# Patient Record
Sex: Female | Born: 1940 | Race: White | Hispanic: No | Smoking: Never smoker
Health system: Southern US, Community
[De-identification: ages and names within clinical notes are randomized; demographics above are authoritative.]

## PROBLEM LIST (undated history)

## (undated) DIAGNOSIS — I10 Essential (primary) hypertension: Secondary | ICD-10-CM

## (undated) DIAGNOSIS — F419 Anxiety disorder, unspecified: Secondary | ICD-10-CM

## (undated) HISTORY — PX: ROTATOR CUFF REPAIR: SHX139

## (undated) HISTORY — PX: BUNIONECTOMY: SHX129

## (undated) HISTORY — PX: FRACTURE SURGERY: SHX138

## (undated) HISTORY — PX: ABDOMINAL HYSTERECTOMY: SHX81

## (undated) HISTORY — PX: EYE SURGERY: SHX253

---

## 2018-08-23 ENCOUNTER — Encounter: Payer: Self-pay | Admitting: Emergency Medicine

## 2018-08-23 ENCOUNTER — Other Ambulatory Visit: Payer: Self-pay

## 2018-08-23 ENCOUNTER — Ambulatory Visit
Admission: EM | Admit: 2018-08-23 | Discharge: 2018-08-23 | Disposition: A | Discharge status: Home / Self Care | Payer: Medicare PPO | Attending: Family Medicine | Admitting: Family Medicine

## 2018-08-23 DIAGNOSIS — N3000 Acute cystitis without hematuria: Secondary | ICD-10-CM | POA: Diagnosis not present

## 2018-08-23 HISTORY — DX: Essential (primary) hypertension: I10

## 2018-08-23 HISTORY — DX: Anxiety disorder, unspecified: F41.9

## 2018-08-23 LAB — URINALYSIS, COMPLETE (UACMP) WITH MICROSCOPIC
Bilirubin Urine: NEGATIVE
Glucose, UA: NEGATIVE mg/dL
Ketones, ur: NEGATIVE mg/dL
Nitrite: NEGATIVE
Protein, ur: NEGATIVE mg/dL
Specific Gravity, Urine: 1.02 (ref 1.005–1.030)
pH: 6 (ref 5.0–8.0)

## 2018-08-23 MED ORDER — CIPROFLOXACIN HCL 500 MG PO TABS: 500.0000 mg | ORAL_TABLET | Freq: Two times a day (BID) | ORAL | 0 refills | Status: AC

## 2018-08-23 NOTE — ED Provider Notes (Signed)
[address], [address] [place], [place] [address] [phone]   Name: Ann Bush DOB: 06/18/1940 MRN: [id number] CSN: [id number] PCP: Marlan PalauGard, [name]  Arrival date and time:  08/23/18 1258  Chief Complaint:  Back Pain  NOTE: Prior to seeing the patient today, I have reviewed the triage nursing documentation and vital signs. Clinical staff has updated patient's PMH/PSHx, current medication list, and drug allergies/intolerances to ensure comprehensive history available to assist in medical decision making.   History:   HPI: Ann Bush is a 78 y.o. female who presents today with complaints of pain in RIGHT side of her lower back and abdomen that began with acute onset on 08/22/2018. No known injuries to her back. She denies dysuria and urinary frequency. No nausea, vomiting, or changes to her bowel habits. Patient has not appreciated any gross hematuria. Patient has not mounted a fever response to her current symptoms. Patient denies PMH being significant for recurrent urinary tract infections.   Past Medical History:  Diagnosis Date  . Anxiety   . Hypertension     Past Surgical History:  Procedure Laterality Date  . ABDOMINAL HYSTERECTOMY    . BUNIONECTOMY Bilateral   . EYE SURGERY    . FRACTURE SURGERY     nose, wrist  . ROTATOR CUFF REPAIR Right     History reviewed. No pertinent family history.  Social History   Socioeconomic History  . Marital status: Widowed    Spouse name: Not on file  . Number of children: Not on file  . Years of education: Not on file  . Highest education level: Not on file  Occupational History  . Not on file  Social Needs  . Financial resource strain: Not on file  . Food insecurity:    Worry: Not on file    Inability: Not on file  . Transportation needs:    Medical: Not on file    Non-medical: Not on file  Tobacco Use  . Smoking status: Never Smoker  . Smokeless tobacco: Never Used  Substance and Sexual Activity  . Alcohol  use: Not Currently  . Drug use: Not Currently  . Sexual activity: Not on file  Lifestyle  . Physical activity:    Days per week: Not on file    Minutes per session: Not on file  . Stress: Not on file  Relationships  . Social connections:    Talks on phone: Not on file    Gets together: Not on file    Attends religious service: Not on file    Active member of club or organization: Not on file    Attends meetings of clubs or organizations: Not on file    Relationship status: Not on file  . Intimate partner violence:    Fear of current or ex partner: Not on file    Emotionally abused: Not on file    Physically abused: Not on file    Forced sexual activity: Not on file  Other Topics Concern  . Not on file  Social History Narrative  . Not on file    There are no active problems to display for this patient.   Home Medications:    Current Meds  Medication Sig  . carvedilol (COREG) 12.5 MG tablet Take by mouth.  . escitalopram (LEXAPRO) 20 MG tablet 1 tab daily    Allergies:   Patient has no known allergies.  Review of Systems (ROS): Review of Systems  Constitutional: Negative for chills and fever.  Respiratory: Negative for  cough and shortness of breath.   Cardiovascular: Negative for chest pain and palpitations.  Gastrointestinal: Positive for abdominal pain (RLQ into groin; no flank pain). Negative for abdominal distention, constipation, diarrhea, nausea and vomiting.  Genitourinary: Positive for decreased urine volume. Negative for dysuria, flank pain, frequency, hematuria and urgency.  Musculoskeletal: Positive for back pain (RIGHT lower).  Skin: Negative for color change and rash.  All other systems reviewed and are negative.    Physical Exam:  Triage Vital Signs ED Triage Vitals  Enc Vitals Group     BP 08/23/18 1322 (!) 168/74     Pulse Rate 08/23/18 1322 80     Resp 08/23/18 1322 18     Temp 08/23/18 1322 98.2 F (36.8 C)     Temp Source 08/23/18 1322  Oral     SpO2 08/23/18 1322 96 %     Weight 08/23/18 1316 168 lb (76.2 kg)     Height 08/23/18 1316 5\' 6"  (1.676 m)     Head Circumference --      Peak Flow --      Pain Score 08/23/18 1316 4     Pain Loc --      Pain Edu? --      Excl. in GC? --     Physical Exam  Constitutional: She is oriented to person, place, and time and well-developed, well-nourished, and in no distress.  HENT:  Head: Normocephalic and atraumatic.  Mouth/Throat: Mucous membranes are normal.  Neck: Neck supple.  Cardiovascular: Normal rate, regular rhythm, normal heart sounds and intact distal pulses. Exam reveals no gallop and no friction rub.  No murmur heard. Pulmonary/Chest: Effort normal and breath sounds normal. No respiratory distress. She has no wheezes. She has no rales.  Abdominal: Soft. Bowel sounds are normal. She exhibits no distension. There is abdominal tenderness in the right lower quadrant and suprapubic area. There is CVA tenderness (RIGHT).  Musculoskeletal:     Lumbar back: She exhibits pain (RIGHT lower).  Neurological: She is alert and oriented to person, place, and time.  Skin: Skin is warm and dry. No rash noted. No erythema.  Psychiatric: Mood, affect and judgment normal.  Nursing note and vitals reviewed.    Urgent Care Treatments / Results:   LABS: PLEASE NOTE: all labs that were ordered this encounter are listed, however only abnormal results are displayed. Labs Reviewed  URINALYSIS, COMPLETE (UACMP) WITH MICROSCOPIC - Abnormal; Notable for the following components:      Result Value   Hgb urine dipstick TRACE (*)    Leukocytes,Ua TRACE (*)    Bacteria, UA FEW (*)    All other components within normal limits    EKG: No orders found for this or any previous visit.  RADIOLOGY: No results found.  PRODEDURES: Procedures  MEDICATIONS RECEIVED THIS VISIT: Medications - No data to display  PERTINENT CLINICAL COURSE NOTES/UPDATES: No data to display   Initial  Impression / Assessment and Plan / Urgent Care Course:    Ann Bush is a 78 y.o. female who presents to [facility] today with complaints of Back Pain  Pertinent labs & imaging results that were available during my care of the patient were personally reviewed by me and considered in my medical decision making (see lab/imaging section of note for values and interpretations).  No fevers or overt urinary symptoms. Her exam, however reveals CVAT on the RIGHT, and pain in her RIGHT lower abdomen and suprapubic area.  UA positive for LE;  reflex culture sent. Will cover with a 5 day course of ciprofloxacin. May use Tylenol and/or Ibuprofen as needed for pain. Discussed Azo products should she develop dysuria. Encouraged to increase fluid intake and avoid caffeine to prevent bladder spasms.   Discussed follow up with primary care physician this week for re-evaluation. I have reviewed the follow up and strict return precautions for any new or worsening symptoms. Patient is aware of symptoms that would be deemed urgent/emergent, and would thus require further evaluation either here or in the emergency department. At the time of discharge, she verbalized understanding and consent with the discharge plan as it was reviewed with her. All questions were fielded by provider and/or clinic staff prior to patient discharge.    Final Clinical Impressions(s) / Urgent Care Diagnoses:   Final diagnoses:  Acute cystitis without hematuria    New Prescriptions:   Meds ordered this encounter  Medications  . ciprofloxacin (CIPRO) 500 MG tablet    Sig: Take 1 tablet (500 mg total) by mouth 2 (two) times daily for 5 days.    Dispense:  10 tablet    Refill:  0    Controlled Substance Prescriptions:  [place] Controlled Substance Registry consulted? Not Applicable  NOTE: This note was prepared using Dragon dictation software along with smaller phrase technology. Despite my best ability to proofread, there is the  potential that transcriptional errors may still occur from this process, and are completely unintentional.     Verlee Monte, NP 08/23/18 1359

## 2018-08-23 NOTE — ED Triage Notes (Signed)
Pt c/o lower back and lower abdomen pain. Started yesterday. She states that the pain is different pain than her normal back pain. Denies dysuria, urinary frequency,

## 2018-08-23 NOTE — Discharge Instructions (Signed)
It was very nice meeting you today in clinic. Thank you for entrusting me with your care.  ° °As discussed, your urine is POSITIVE for infection. Will approach treatment as follows: ° °Prescription has been sent to your pharmacy for antibiotics.  °Please pick up and take as directed. FINISH the entire course of medication even if you are feeling better.  °A culture will be sent on your provided sample. If it comes back resistant to what I have prescribed you, someone will call you and let you know that we will need to change antibiotics. °Increase fluid intake as much as possible to flush your urinary tract.  °Water is always the best.  °Avoid caffeine until your infection clears up, as it can contribute to painful bladder spasms.  °May use Tylenol and/or Ibuprofen as needed for pain/fever. °May use Azo products (over the counter) to help with pain/discomfort.  ° °Make arrangements to follow up with your regular doctor in 1 week for re-evaluation. If your symptoms/condition worsens, please seek follow up care either here or in the ER. Please remember, our [facility] providers are "right here with you" when you need us.  ° °Again, it was my pleasure to take care of you today. Thank you for choosing our clinic. I hope that you start to feel better quickly.  ° °Yoskar Murrillo, MSN, APRN, FNP-C, CEN °Advanced Practice Provider °[facility] ° °

## 2018-08-24 LAB — URINE CULTURE: Culture: NO GROWTH

## 2018-08-26 ENCOUNTER — Other Ambulatory Visit: Payer: Self-pay

## 2018-08-26 ENCOUNTER — Ambulatory Visit
Admission: EM | Admit: 2018-08-26 | Discharge: 2018-08-26 | Disposition: A | Discharge status: Home / Self Care | Payer: Medicare PPO | Attending: Family Medicine | Admitting: Family Medicine

## 2018-08-26 ENCOUNTER — Encounter: Payer: Self-pay | Admitting: Emergency Medicine

## 2018-08-26 DIAGNOSIS — R1031 Right lower quadrant pain: Secondary | ICD-10-CM | POA: Diagnosis not present

## 2018-08-26 DIAGNOSIS — R1032 Left lower quadrant pain: Secondary | ICD-10-CM

## 2018-08-26 LAB — URINALYSIS, COMPLETE (UACMP) WITH MICROSCOPIC
Bilirubin Urine: NEGATIVE
Glucose, UA: NEGATIVE mg/dL
Hgb urine dipstick: NEGATIVE
Ketones, ur: NEGATIVE mg/dL
Leukocytes,Ua: NEGATIVE
Nitrite: NEGATIVE
Protein, ur: NEGATIVE mg/dL
Specific Gravity, Urine: >1.03 — ABNORMAL HIGH (ref 1.005–1.030)
pH: 5 (ref 5.0–8.0)

## 2018-08-26 NOTE — ED Provider Notes (Signed)
[facility] ____________________________________________  Time seen: Approximately 2:56 PM  I have reviewed the triage vital signs and the nursing notes.   HISTORY  Chief Complaint Abdominal Pain   HPI Ann Bush is a 78 y.o. female presenting for evaluation of right lower back and right lower quadrant abdominal pain present for the last 4 days.  Patient reports pain has been constant and moderate to severe.  States pressing on abdomen as well as standing straight up hurts worse.  Denies alleviating measures.  Was seen in urgent care and was treated for concern for UTI with oral Cipro for 5 days, and states Cipro has not helped discomfort at all.  Denies dysuria, urinary frequency, urinary urgency, burning with urination or vaginal complaints.  No accompanying fever.  Continues eat and drink well.  No nausea, vomiting, diarrhea or constipation.  Normal bowel movements.  Denies injury, rash, fevers, chest pain, shortness of breath or other complaints. States she is unsure if she has had an appendectomy.   Marlan PalauGard, [name]: PCP   Past Medical History:  Diagnosis Date  . Anxiety   . Hypertension     There are no active problems to display for this patient.   Past Surgical History:  Procedure Laterality Date  . ABDOMINAL HYSTERECTOMY    . BUNIONECTOMY Bilateral   . EYE SURGERY    . FRACTURE SURGERY     nose, wrist  . ROTATOR CUFF REPAIR Right   C-section Cholecystectomy Total hysterectomy   No current facility-administered medications for this encounter.   Current Outpatient Medications:  .  carvedilol (COREG) 12.5 MG tablet, Take by mouth., Disp: , Rfl:  .  ciprofloxacin (CIPRO) 500 MG tablet, Take 1 tablet (500 mg total) by mouth 2 (two) times daily for 5 days., Disp: 10 tablet, Rfl: 0 .  escitalopram (LEXAPRO) 20 MG tablet, 1 tab daily, Disp: , Rfl:   Allergies Patient has no known allergies.  History reviewed. No pertinent family history.  Social  History Social History   Tobacco Use  . Smoking status: Never Smoker  . Smokeless tobacco: Never Used  Substance Use Topics  . Alcohol use: Not Currently  . Drug use: Not Currently    Review of Systems Constitutional: No fever Cardiovascular: Denies chest pain. Respiratory: Denies shortness of breath. Gastrointestinal: Positive abdominal pain. Genitourinary: Negative for dysuria. Musculoskeletal: Positive for back pain. Skin: Negative for rash.   ____________________________________________   PHYSICAL EXAM:  VITAL SIGNS: ED Triage Vitals  Enc Vitals Group     BP 08/26/18 1434 140/76     Pulse Rate 08/26/18 1434 74     Resp 08/26/18 1434 18     Temp 08/26/18 1434 98.6 F (37 C)     Temp Source 08/26/18 1434 Oral     SpO2 08/26/18 1434 97 %     Weight 08/26/18 1432 167 lb 15.9 oz (76.2 kg)     Height --      Head Circumference --      Peak Flow --      Pain Score 08/26/18 1431 10     Pain Loc --      Pain Edu? --      Excl. in GC? --     Constitutional: Alert and oriented. Appears uncomfortable.  ENT      Head: Normocephalic and atraumatic. Cardiovascular: Normal rate, regular rhythm. Grossly normal heart sounds.  Good peripheral circulation. Respiratory: Normal respiratory effort without tachypnea nor retractions. Breath sounds are clear and equal bilaterally.  No wheezes, rales, rhonchi. Gastrointestinal: Normal bowel sounds.  Severe right lower quadrant abdominal pain.  Moderate left lower quadrant abdominal pain.  Mild right CVA tenderness. No left CVA tenderness. Guarding right abdomen. Musculoskeletal: No midline cervical, thoracic or lumbar tenderness. Neurologic:  Normal speech and language.Speech is normal. No gait instability.  Skin:  Skin is warm, dry and intact. No rash noted. Psychiatric: Mood and affect are normal. Speech and behavior are normal. Patient exhibits appropriate insight and judgment   ___________________________________________   LABS  (all labs ordered are listed, but only abnormal results are displayed)  Labs Reviewed  URINALYSIS, COMPLETE (UACMP) WITH MICROSCOPIC - Abnormal; Notable for the following components:      Result Value   APPearance HAZY (*)    Specific Gravity, Urine >1.030 (*)    Bacteria, UA RARE (*)    All other components within normal limits   ____________________________________________   PROCEDURES Procedures    INITIAL IMPRESSION / ASSESSMENT AND PLAN / ED COURSE  Pertinent labs & imaging results that were available during my care of the patient were reviewed by me and considered in my medical decision making (see chart for details).  Patient presenting for evaluation of right flank, right lower quadrant abdominal pain, with also noted left lower quadrant abdominal pain.  Urine culture from 3 days ago was negative.  Cipro unchanged symptoms.  Discussed multiple differentials with patient including kidney stone, appendicitis, diverticulitis and intra-abdominal infection, recommend further evaluation emergency room at this time.  Patient agrees.  Patient states that she will go to [facility] by private vehicle.  Encouraged to remain n.p.o.  Patient stable at time of discharge.   ____________________________________________   FINAL CLINICAL IMPRESSION(S) / ED DIAGNOSES  Final diagnoses:  RLQ abdominal pain  LLQ abdominal pain     ED Discharge Orders    None       Note: This dictation was prepared with Dragon dictation along with smaller phrase technology. Any transcriptional errors that result from this process are unintentional.         Renford Dills, NP 08/26/18 1525

## 2018-08-26 NOTE — ED Triage Notes (Signed)
Pt c/o RLQ and suprapubic pain and right lower back pain. She was seen on 08/23/18 for this and treated with cipro and it is not working. She states that it hurts to stand up straight.

## 2018-08-26 NOTE — Discharge Instructions (Signed)
Proceed directly to the emergency room as discussed.

## 2019-01-16 ENCOUNTER — Encounter: Payer: Self-pay | Admitting: Emergency Medicine

## 2019-01-16 ENCOUNTER — Emergency Department: Payer: Medicare PPO

## 2019-01-16 ENCOUNTER — Emergency Department
Admission: EM | Admit: 2019-01-16 | Discharge: 2019-01-17 | Disposition: A | Discharge status: Home / Self Care | Payer: Medicare PPO | Attending: Emergency Medicine | Admitting: Emergency Medicine

## 2019-01-16 DIAGNOSIS — I10 Essential (primary) hypertension: Secondary | ICD-10-CM | POA: Diagnosis not present

## 2019-01-16 DIAGNOSIS — R0602 Shortness of breath: Secondary | ICD-10-CM | POA: Diagnosis present

## 2019-01-16 DIAGNOSIS — Z79899 Other long term (current) drug therapy: Secondary | ICD-10-CM | POA: Insufficient documentation

## 2019-01-16 DIAGNOSIS — U071 COVID-19: Secondary | ICD-10-CM | POA: Diagnosis not present

## 2019-01-16 LAB — COMPREHENSIVE METABOLIC PANEL
ALT: 17 U/L (ref 0–44)
AST: 19 U/L (ref 15–41)
Albumin: 3.9 g/dL (ref 3.5–5.0)
Alkaline Phosphatase: 73 U/L (ref 38–126)
Anion gap: 10 (ref 5–15)
BUN: 19 mg/dL (ref 8–23)
CO2: 22 mmol/L (ref 22–32)
Calcium: 9.2 mg/dL (ref 8.9–10.3)
Chloride: 110 mmol/L (ref 98–111)
Creatinine, Ser: 1.04 mg/dL — ABNORMAL HIGH (ref 0.44–1.00)
GFR calc Af Amer: >60 mL/min (ref >60)
GFR calc non Af Amer: 52 mL/min — ABNORMAL LOW (ref >60)
Glucose, Bld: 101 mg/dL — ABNORMAL HIGH (ref 70–99)
Potassium: 4.2 mmol/L (ref 3.5–5.1)
Sodium: 142 mmol/L (ref 135–145)
Total Bilirubin: 0.7 mg/dL (ref 0.3–1.2)
Total Protein: 6.4 g/dL — ABNORMAL LOW (ref 6.5–8.1)

## 2019-01-16 LAB — CBC
HCT: 41 % (ref 36.0–46.0)
Hemoglobin: 13.8 g/dL (ref 12.0–15.0)
MCH: 27.8 pg (ref 26.0–34.0)
MCHC: 33.7 g/dL (ref 30.0–36.0)
MCV: 82.7 fL (ref 80.0–100.0)
Platelets: 132 10*3/uL — ABNORMAL LOW (ref 150–400)
RBC: 4.96 MIL/uL (ref 3.87–5.11)
RDW: 13.2 % (ref 11.5–15.5)
WBC: 2.4 10*3/uL — ABNORMAL LOW (ref 4.0–10.5)
nRBC: 0 % (ref 0.0–0.2)

## 2019-01-16 LAB — TROPONIN I (HIGH SENSITIVITY): Troponin I (High Sensitivity): 9 ng/L (ref <18)

## 2019-01-16 NOTE — ED Triage Notes (Signed)
Pt arrived via EMS from home where pt reports she was diagnosed with COVID on 9/25 and since has became weaker and SOB. Pt is A&O x4 on arrival to ED.

## 2019-01-16 NOTE — ED Provider Notes (Signed)
[facility] Emergency Department Provider Note  Time seen: 11:31 PM  I have reviewed the triage vital signs and the nursing notes.   HISTORY  Chief Complaint Shortness of Breath   HPI Ann Bush is a 78 y.o. female with a past medical history of anxiety, hypertension, presents to the emergency department for generalized fatigue and shortness of breath.  According to the patient she was diagnosed with COVID 01/10/2019.  Patient states over the past 3 days she has become more short of breath and feeling weak.  Patient denies any fevers known to her at home.  States the cough is largely resolved.  Denies any vomiting or diarrhea.  Denies dysuria.  Denies abdominal or chest pain.   Past Medical History:  Diagnosis Date  . Anxiety   . Hypertension     There are no active problems to display for this patient.   Past Surgical History:  Procedure Laterality Date  . ABDOMINAL HYSTERECTOMY    . BUNIONECTOMY Bilateral   . EYE SURGERY    . FRACTURE SURGERY     nose, wrist  . ROTATOR CUFF REPAIR Right     Prior to Admission medications   Medication Sig Start Date End Date Taking? Authorizing Provider  carvedilol (COREG) 12.5 MG tablet Take by mouth. 06/20/18 06/21/19  [provider]  escitalopram (LEXAPRO) 20 MG tablet 1 tab daily 06/20/18   [provider]    No Known Allergies  History reviewed. No pertinent family history.  Social History Social History   Tobacco Use  . Smoking status: Never Smoker  . Smokeless tobacco: Never Used  Substance Use Topics  . Alcohol use: Not Currently  . Drug use: Not Currently    Review of Systems Constitutional: Negative for fever.  Positive for generalized fatigue/weakness. Cardiovascular: Negative for chest pain. Respiratory: Positive for shortness of breath.  Cough is largely resolved. Gastrointestinal: Negative for abdominal pain, vomiting and diarrhea. Genitourinary: Negative for urinary  compaints Musculoskeletal: Negative for musculoskeletal complaints Skin: Negative for skin complaints  Neurological: Negative for headache All other ROS negative  ____________________________________________   PHYSICAL EXAM:  VITAL SIGNS: ED Triage Vitals [01/16/19 2259]  Enc Vitals Group     BP (!) 152/81     Pulse Rate 73     Resp 20     Temp 98.3 F (36.8 C)     Temp Source Oral     SpO2 98 %     Weight      Height      Head Circumference      Peak Flow      Pain Score      Pain Loc      Pain Edu?      Excl. in GC?    Constitutional: Alert and oriented. Well appearing and in no distress. Eyes: Normal exam ENT      Head: Normocephalic and atraumatic.      Mouth/Throat: Mucous membranes are moist. Cardiovascular: Normal rate, regular rhythm.  Respiratory: Normal respiratory effort with slight tachypnea.  Breath sounds are clear, without obvious wheeze rales or rhonchi. Gastrointestinal: Soft and nontender. No distention.  Musculoskeletal: Nontender with normal range of motion in all extremities. No lower extremity tenderness or edema. Neurologic:  Normal speech and language. No gross focal neurologic deficits Skin:  Skin is warm, dry and intact.  Psychiatric: Mood and affect are normal.   ____________________________________________    EKG  EKG viewed and interpreted by myself shows a normal  sinus rhythm at 75 bpm with a narrow QRS, normal axis, normal intervals, no concerning ST changes.  ____________________________________________    RADIOLOGY  IMPRESSION:  Artifact versus possible faint airspace density in the right upper  lobe.   ____________________________________________   INITIAL IMPRESSION / ASSESSMENT AND PLAN / ED COURSE  Pertinent labs & imaging results that were available during my care of the patient were reviewed by me and considered in my medical decision making (see chart for details).   Patient presents to the emergency department  for weakness and shortness of breath.  Patient diagnosed with COVID approximately 6 days ago, symptoms for the past 7 to 8 days.  Differential would include pneumonia, worsening viral illness, metabolic or electrolyte abnormality, dehydration or renal insufficiency.  We will check labs, chest x-ray and continue to closely monitor.  Overall the patient appears well currently satting 99% on room air.  Patient's work-up is essentially negative.  Reassuringly patient continues to sat 97 to 99% throughout my evaluation.  Chest x-ray shows possible faint airspace opacity.  We will discharge the patient with Zithromax, albuterol to be used if needed.  Patient agreeable to plan of care.  I discussed my normal COVID return precautions.  Ann Bush was evaluated in Emergency Department on 01/16/2019 for the symptoms described in the history of present illness. She was evaluated in the context of the global COVID-19 pandemic, which necessitated consideration that the patient might be at risk for infection with the SARS-CoV-2 virus that causes COVID-19. Institutional protocols and algorithms that pertain to the evaluation of patients at risk for COVID-19 are in a state of rapid change based on information released by regulatory bodies including the CDC and federal and state organizations. These policies and algorithms were followed during the patient's care in the ED.  ____________________________________________   FINAL CLINICAL IMPRESSION(S) / ED DIAGNOSES  Dyspnea Weakness COVID-19   Harvest Dark, MD 01/16/19 2359

## 2019-01-17 MED ORDER — AZITHROMYCIN 250 MG PO TABS: ORAL_TABLET | ORAL | 0 refills | Status: AC

## 2019-01-17 MED ORDER — ALBUTEROL SULFATE HFA 108 (90 BASE) MCG/ACT IN AERS: 2.0000 | INHALATION_SPRAY | Freq: Four times a day (QID) | RESPIRATORY_TRACT | 1 refills | Status: AC | PRN

## 2019-01-17 MED ORDER — ALBUTEROL SULFATE HFA 108 (90 BASE) MCG/ACT IN AERS
2.0000 | INHALATION_SPRAY | Freq: Once | RESPIRATORY_TRACT | Status: AC
  Administered 2019-01-17: 2 via RESPIRATORY_TRACT
  Filled 2019-01-17: qty 6.7

## 2019-06-12 ENCOUNTER — Ambulatory Visit: Payer: Medicare PPO | Attending: Internal Medicine

## 2019-06-12 DIAGNOSIS — Z23 Encounter for immunization: Secondary | ICD-10-CM | POA: Insufficient documentation

## 2019-06-12 NOTE — Progress Notes (Signed)
   Covid-19 Vaccination Clinic  Name:  Tyrika Newman    MRN: [id number] DOB: 1941/03/26  06/12/2019  Ms. Lyall was observed post Covid-19 immunization for 15 minutes without incidence. She was provided with Vaccine Information Sheet and instruction to access the V-Safe system.   Ms. Mccay was instructed to call 911 with any severe reactions post vaccine: Marland Kitchen Difficulty breathing  . Swelling of your face and throat  . A fast heartbeat  . A bad rash all over your body  . Dizziness and weakness    Immunizations Administered    Name Date Dose VIS Date Route   Pfizer COVID-19 Vaccine 06/12/2019 12:12 PM 0.3 mL 03/28/2019 Intramuscular   Manufacturer: [employer], [employer]   Lot: [id number]   NDC: [address]-1

## 2019-07-09 ENCOUNTER — Ambulatory Visit: Payer: Medicare PPO | Attending: Internal Medicine

## 2019-07-09 DIAGNOSIS — Z23 Encounter for immunization: Secondary | ICD-10-CM

## 2019-07-09 NOTE — Progress Notes (Signed)
   Covid-19 Vaccination Clinic  Name:  Ann Bush    MRN: [id number] DOB: 1940-05-15  07/09/2019  Ann Bush was observed post Covid-19 immunization for 15 minutes without incident. She was provided with Vaccine Information Sheet and instruction to access the V-Safe system.   Ann Bush was instructed to call 911 with any severe reactions post vaccine: Marland Kitchen Difficulty breathing  . Swelling of face and throat  . A fast heartbeat  . A bad rash all over body  . Dizziness and weakness   Immunizations Administered    Name Date Dose VIS Date Route   Pfizer COVID-19 Vaccine 07/09/2019  2:13 PM 0.3 mL 03/28/2019 Intramuscular   Manufacturer: [employer], [employer]   Lot: [id number]   NDC: [address]-8

## 2020-07-04 IMAGING — DX DG CHEST 1V PORT
1 series · 1 of 1 positions shown · non-contrast
Comparison: None.

CLINICAL DATA: 78-year-old female with shortness of breath. Patient
was diagnosed with [id number] on 01/10/2019

EXAM:
PORTABLE CHEST 1 VIEW

[chest ap]
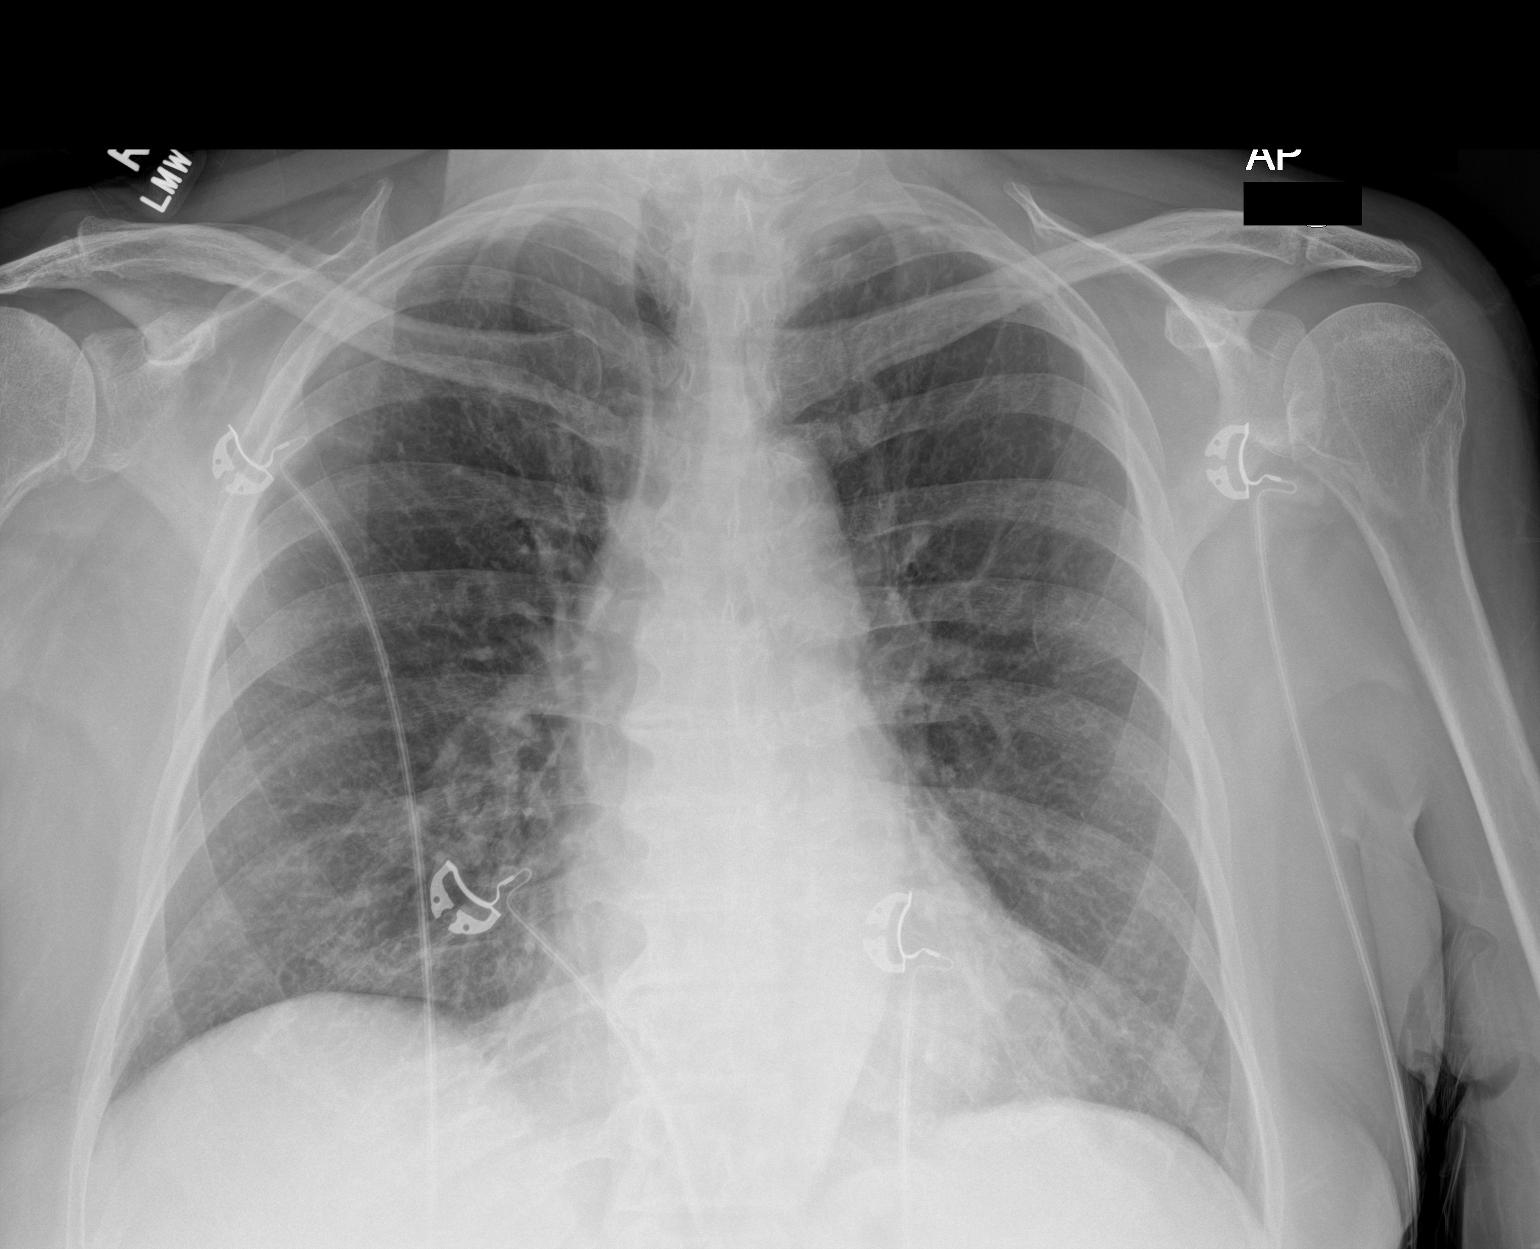

[1 of 1 positions shown; findings below may reference images not displayed]

FINDINGS: Faint area of density may be present peripheral/subpleural right
upper lobe. No consolidative changes. There is no pleural effusion
pneumothorax. The cardiac silhouette is within normal limits. No
acute osseous pathology.
IMPRESSION: Artifact versus possible faint airspace density in the right upper
lobe.

## 2023-03-31 ENCOUNTER — Ambulatory Visit
Admission: EM | Admit: 2023-03-31 | Discharge: 2023-03-31 | Payer: Medicare Other | Attending: Emergency Medicine | Admitting: Emergency Medicine

## 2023-03-31 ENCOUNTER — Encounter: Payer: Self-pay | Admitting: Emergency Medicine

## 2023-03-31 DIAGNOSIS — R0602 Shortness of breath: Secondary | ICD-10-CM

## 2023-03-31 DIAGNOSIS — R0789 Other chest pain: Secondary | ICD-10-CM

## 2023-03-31 NOTE — ED Triage Notes (Signed)
Patient states that she had carotid surgery don at the end of last month.  Patient reports chest pain and SOB for about a week.  Patient states that she has not felt any better.  Patient denies fevers or cold symptoms.

## 2023-03-31 NOTE — Discharge Instructions (Addendum)
Please go to [facility] emergency department for further evaluation of your 3 to 4 days of substernal chest pressure with associated shortness of breath and sweating.  Please have your daughter drive you.  Please go now.

## 2023-03-31 NOTE — ED Notes (Signed)
Patient is being discharged from the Urgent Care and sent to the Emergency Department via [facility] . Per Berneta Sages NP, patient is in need of higher level of care due to chest pain SOB. Patient is aware and verbalizes understanding of plan of care.  Vitals:   03/31/23 1324  BP: (!) 141/67  Pulse: 79  Resp: 14  Temp: 97.6 F (36.4 C)  SpO2: 99%

## 2023-03-31 NOTE — ED Provider Notes (Addendum)
[facility]    CSN: [id number] Arrival date & time: 03/31/23  1312      History   Chief Complaint Chief Complaint  Patient presents with   Chest Pain   Shortness of Breath    HPI Ann Bush is a 82 y.o. female.   HPI  82 year old female with a past medical history significant for anxiety, hypertension, and carotid artery stenosis presents for evaluation of chest pain and shortness of breath.  She had a carotid stent placement in her left carotid artery 4 weeks ago at [facility].  She reports that she developed chest pain approximately a week ago and she denies any chest pain at present but does endorse a 5/10 substernal chest pressure that feels like someone is sitting on her chest.  That has been constant over the last 3 to 4 days.  It is associated with some sweating but she denies nausea.  Patient has mild dyspnea in the exam room.  Past Medical History:  Diagnosis Date   Anxiety    Hypertension     There are no active problems to display for this patient.   Past Surgical History:  Procedure Laterality Date   ABDOMINAL HYSTERECTOMY     BUNIONECTOMY Bilateral    EYE SURGERY     FRACTURE SURGERY     nose, wrist   ROTATOR CUFF REPAIR Right     OB History   No obstetric history on file.      Home Medications    Prior to Admission medications   Medication Sig Start Date End Date Taking? Authorizing Provider  alendronate (FOSAMAX) 70 MG tablet Take by mouth. 06/08/22 06/08/23 Yes [provider]  aspirin 81 MG chewable tablet Chew by mouth. 01/17/23 01/17/24 Yes [provider]  atorvastatin (LIPITOR) 20 MG tablet Take by mouth. 12/27/22 12/27/23 Yes [provider]  clopidogrel (PLAVIX) 75 MG tablet Take by mouth. 01/31/23 01/31/24 Yes [provider]  albuterol (VENTOLIN HFA) 108 (90 Base) MCG/ACT inhaler Inhale 2 puffs into the lungs every 6 (six) hours as needed for wheezing or shortness of breath. 01/17/19    Minna Antis, MD  carvedilol (COREG) 12.5 MG tablet Take by mouth. 06/20/18 06/21/19  [provider]  ergocalciferol, VITAMIN D2, (DRISDOL) 200 MCG/ML drops Take by mouth.    [provider]  escitalopram (LEXAPRO) 20 MG tablet 1 tab daily 06/20/18   [provider]    Family History History reviewed. No pertinent family history.  Social History Social History   Tobacco Use   Smoking status: Never   Smokeless tobacco: Never  Vaping Use   Vaping status: Never Used  Substance Use Topics   Alcohol use: Not Currently   Drug use: Not Currently     Allergies   Patient has no known allergies.   Review of Systems Review of Systems  Constitutional:  Positive for diaphoresis. Negative for fever.  Respiratory:  Positive for shortness of breath.   Cardiovascular:  Positive for chest pain.  Gastrointestinal:  Negative for nausea.     Physical Exam Triage Vital Signs ED Triage Vitals [03/31/23 1321]  Encounter Vitals Group     BP      Systolic BP Percentile      Diastolic BP Percentile      Pulse      Resp      Temp      Temp src      SpO2      Weight 167 lb  15.9 oz (76.2 kg)     Height 5\' 6"  (1.676 m)     Head Circumference      Peak Flow      Pain Score 5     Pain Loc      Pain Education      Exclude from Growth Chart    No data found.  Updated Vital Signs BP (!) 141/67 (BP Location: Left Arm)   Pulse 79   Temp 97.6 F (36.4 C) (Oral)   Resp 14   Ht 5\' 6"  (1.676 m)   Wt 167 lb 15.9 oz (76.2 kg)   SpO2 99%   BMI 27.11 kg/m   Visual Acuity Right Eye Distance:   Left Eye Distance:   Bilateral Distance:    Right Eye Near:   Left Eye Near:    Bilateral Near:     Physical Exam Vitals and nursing note reviewed.  Constitutional:      General: She is in acute distress.     Appearance: Normal appearance. She is not ill-appearing.     Comments: Patient has mild dyspnea and appears mildly uncomfortable in the exam room.  HENT:      Head: Normocephalic and atraumatic.  Cardiovascular:     Rate and Rhythm: Normal rate and regular rhythm.     Pulses: Normal pulses.     Heart sounds: Normal heart sounds. No murmur heard.    No friction rub. No gallop.  Pulmonary:     Effort: Pulmonary effort is normal.     Breath sounds: Normal breath sounds. No wheezing, rhonchi or rales.  Skin:    General: Skin is warm and dry.     Capillary Refill: Capillary refill takes less than 2 seconds.  Neurological:     General: No focal deficit present.     Mental Status: She is alert and oriented to person, place, and time.      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    Radiology No results found.  Procedures Procedures (including critical care time)  Medications Ordered in UC Medications - No data to display  Initial Impression / Assessment and Plan / UC Course  I have reviewed the triage vital signs and the nursing notes.  Pertinent labs & imaging results that were available during my care of the patient were reviewed by me and considered in my medical decision making (see chart for details).   Patient is a pleasant 82 year old female who demonstrates mild dyspnea and mild increased work of breathing in the exam room presenting for evaluation of substernal chest pressure that is been present for 3 to 4 days with intermittent chest pain that started a week ago.  Also associated with shortness of breath and sweating.  She describes it as something heavy sitting on her chest and rates it a 5/10.  She is 4 weeks status post left carotid artery stent placement for carotid artery stenosis.  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  Patient has a wandering baseline and is throwing multiple PVCs.  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  I do not see any ST elevation on her EKG though I do see flattening of the ST segment in both V1 and V2 as well as low voltage QRS complexes and both of those leads.  Since she is a patient of [facility] she will have her daughter take her to [facility] via POV.  She left ambulatory in stable condition.  Report called to Trinna Post, the nurse at the [facility] transfer center.    Final Clinical Impressions(s) / UC Diagnoses   Final diagnoses:  Sensation of chest pressure  Shortness of breath     Discharge Instructions      Please go to [facility] emergency department for further evaluation of your 3 to 4 days of substernal chest pressure with associated shortness of breath and sweating.  Please have your daughter drive you.  Please go now.     ED Prescriptions   None    PDMP not reviewed this encounter.   Becky Augusta, NP 03/31/23 1356    Becky Augusta, NP 03/31/23 [phone]
